# Patient Record
Sex: Male | Born: 1965 | Race: Black or African American | Hispanic: No | Marital: Married | Smoking: Never smoker
Health system: Southern US, Community
[De-identification: ages and names within clinical notes are randomized; demographics above are authoritative.]

## PROBLEM LIST (undated history)

## (undated) DIAGNOSIS — I10 Essential (primary) hypertension: Secondary | ICD-10-CM

## (undated) HISTORY — DX: Essential (primary) hypertension: I10

---

## 2000-05-12 ENCOUNTER — Ambulatory Visit (HOSPITAL_COMMUNITY): Admission: RE | Admit: 2000-05-12 | Discharge: 2000-05-12 | Payer: Self-pay | Admitting: Internal Medicine

## 2008-01-29 ENCOUNTER — Encounter: Admission: RE | Admit: 2008-01-29 | Discharge: 2008-01-29 | Payer: Self-pay | Admitting: Family Medicine

## 2010-11-22 ENCOUNTER — Other Ambulatory Visit: Payer: Self-pay | Admitting: Gastroenterology

## 2010-11-26 ENCOUNTER — Ambulatory Visit
Admission: RE | Admit: 2010-11-26 | Discharge: 2010-11-26 | Disposition: A | Discharge status: Home / Self Care | Payer: 59 | Source: Ambulatory Visit | Attending: Gastroenterology | Admitting: Gastroenterology

## 2013-10-17 IMAGING — US US ABDOMEN COMPLETE
1 series · 14 of 25 positions shown · non-contrast
Comparison: None

CLINICAL DATA: Abdominal pain for 1 month

ABDOMINAL ULTRASOUND COMPLETE

[Series 1: us abdomen complete · 0.26mm/px · 14 of 59 slices shown]
[im 1/59]
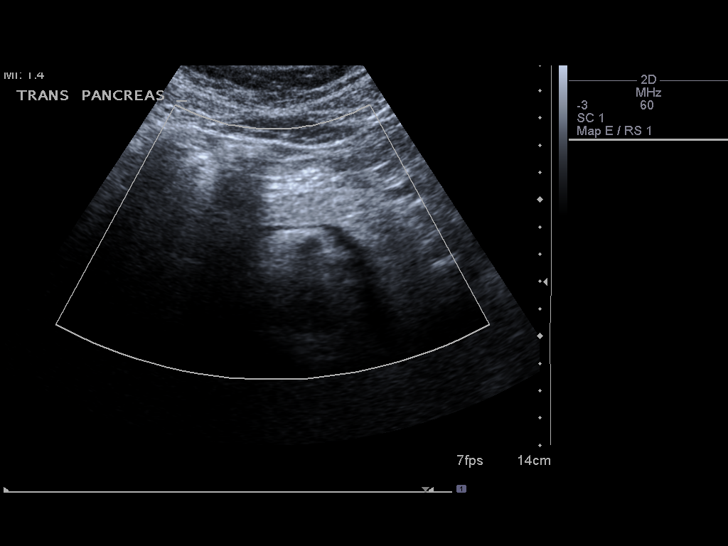
[im 5/59]
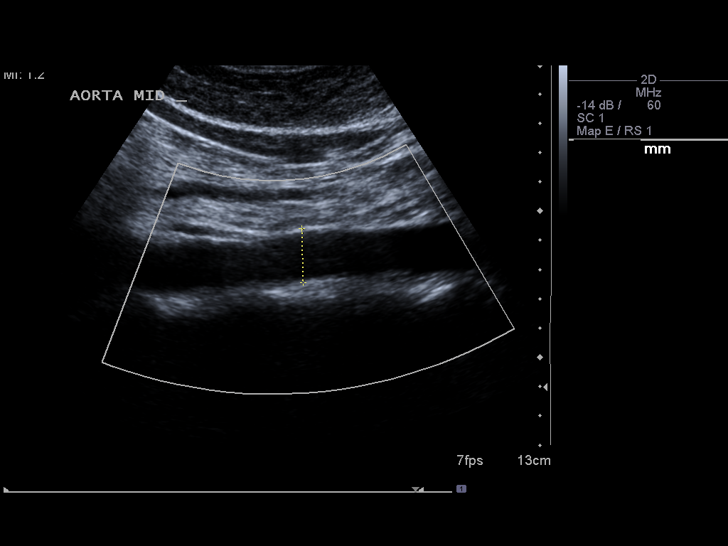
[im 10/59]
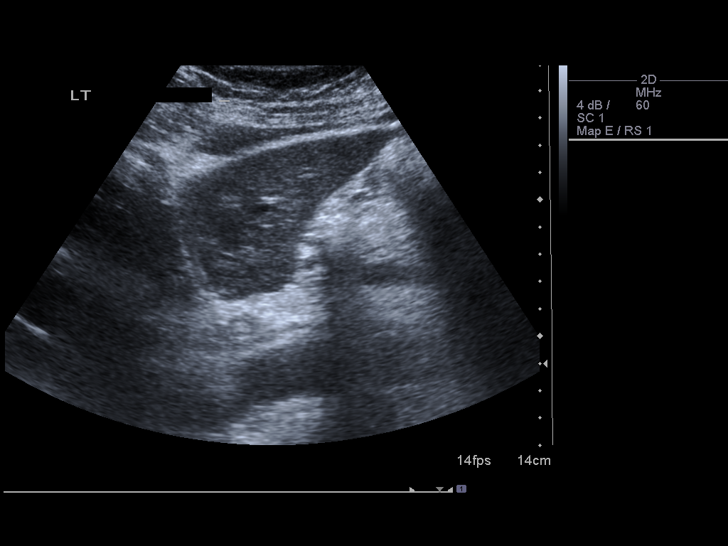
[im 15/59]
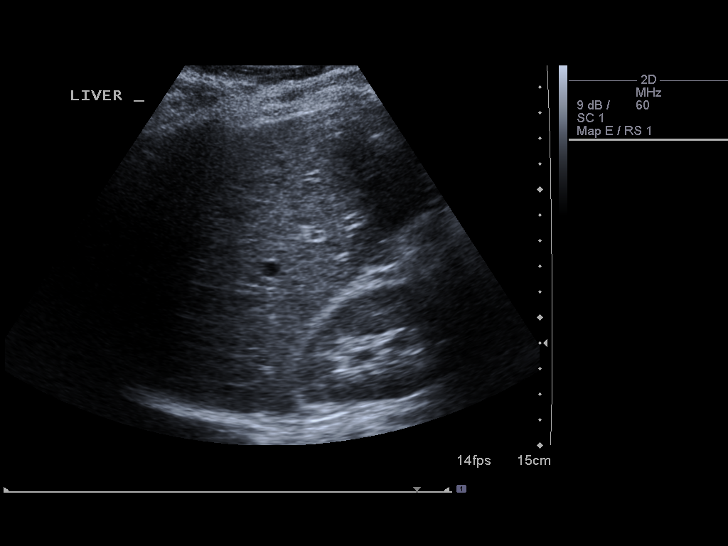
[im 20/59]
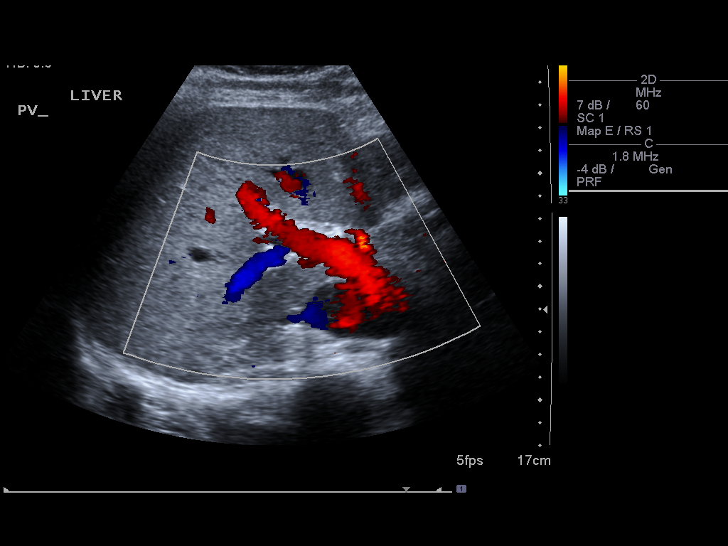
[im 22/59]
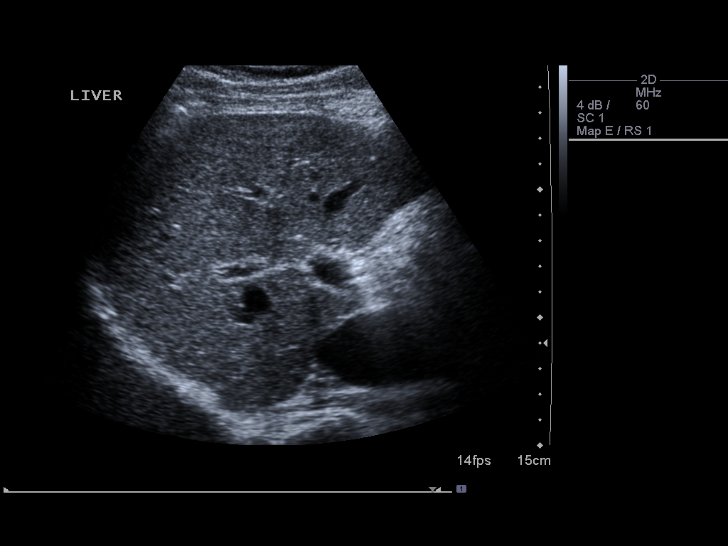
[im 27/59]
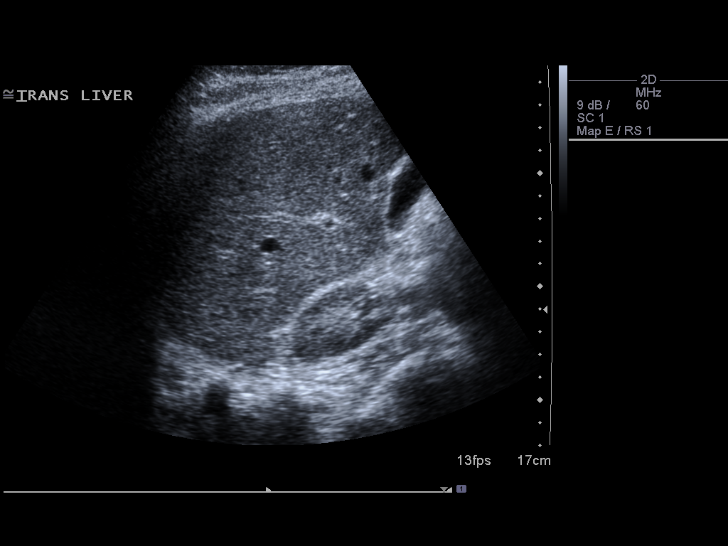
[im 32/59]
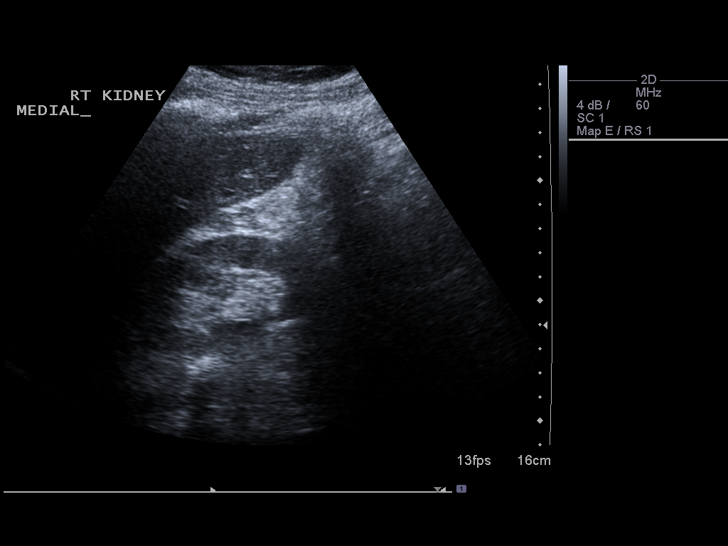
[im 37/59]
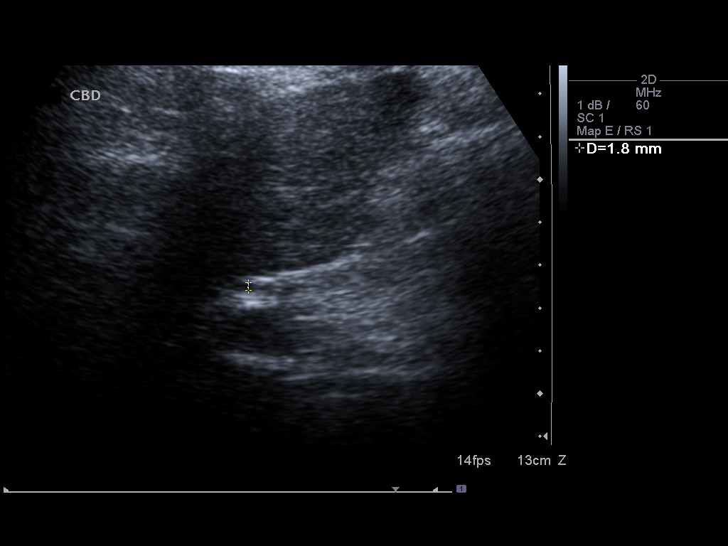
[im 39/59]
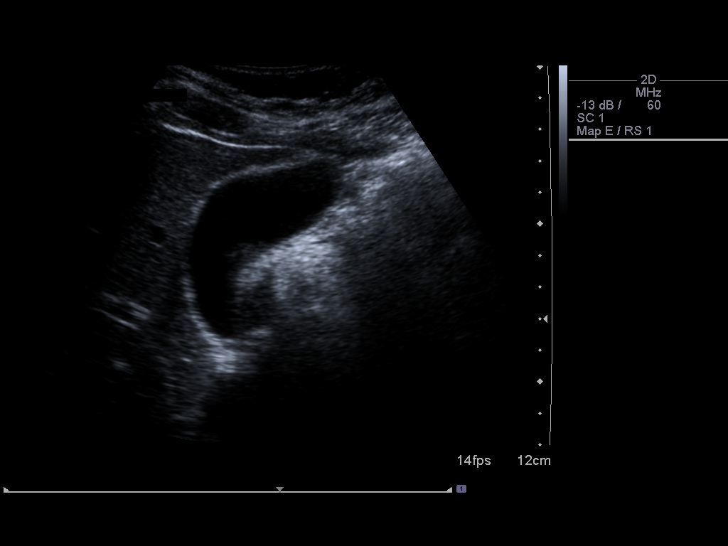
[im 44/59]
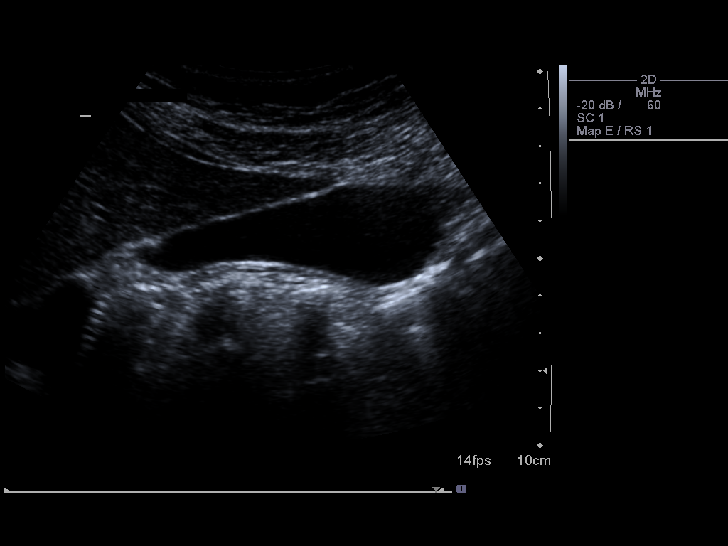
[im 49/59]
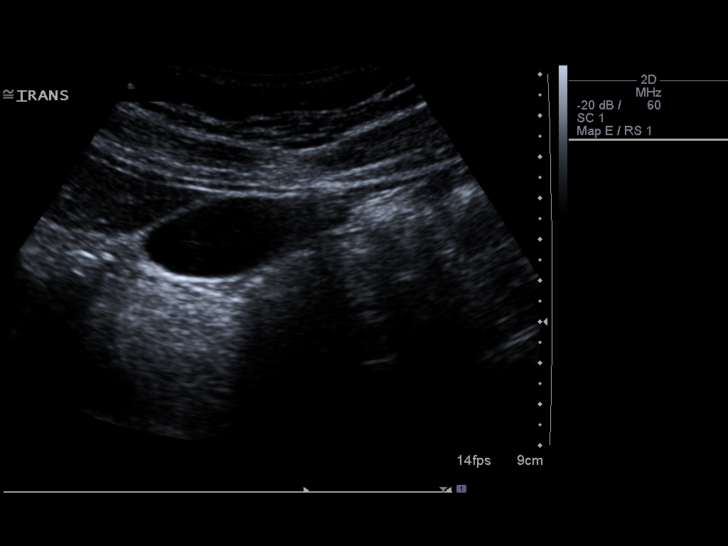
[im 54/59]
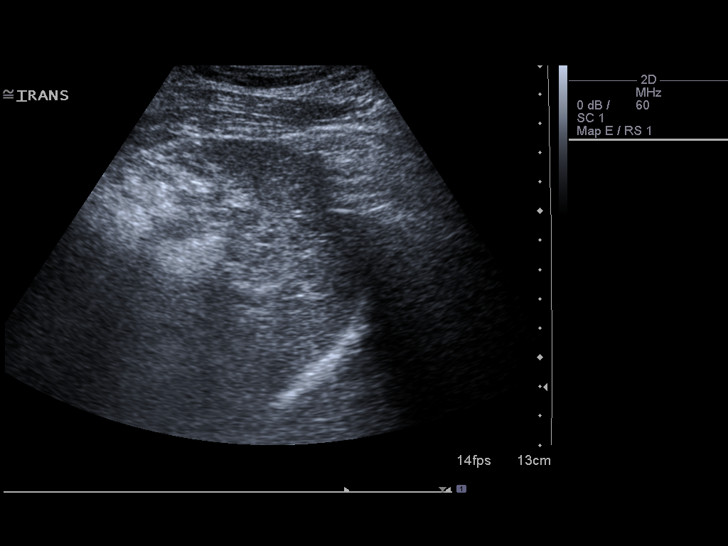
[im 59/59]
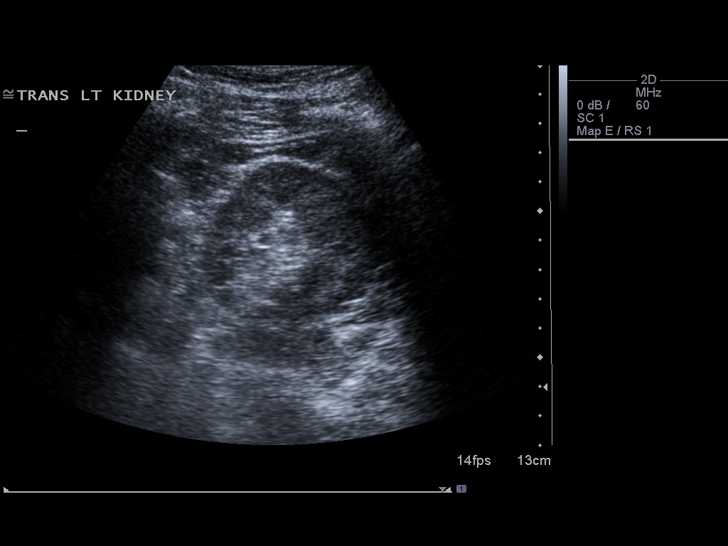

[14 of 25 positions shown; findings below may reference images not displayed]

FINDINGS: Gallbladder:  No gallstones, gallbladder wall thickening, or
pericholecystic fluid.

Common Bile Duct:  Within normal limits in caliber.

Liver: No focal mass lesion identified.  Within normal limits in
parenchymal echogenicity.

IVC:  Appears normal.

Pancreas:  No abnormality identified.

Spleen:  Within normal limits in size and echotexture.

Right kidney:  Normal in size and parenchymal echogenicity.  No
evidence of mass or hydronephrosis.

Left kidney:  Normal in size and parenchymal echogenicity.  No
evidence of mass or hydronephrosis.

Abdominal Aorta:  No aneurysm identified.
IMPRESSION: Negative abdominal ultrasound.

## 2014-05-05 ENCOUNTER — Ambulatory Visit (INDEPENDENT_AMBULATORY_CARE_PROVIDER_SITE_OTHER)
Admission: RE | Admit: 2014-05-05 | Discharge: 2014-05-05 | Disposition: A | Discharge status: Home / Self Care | Payer: 59 | Source: Ambulatory Visit | Attending: Internal Medicine | Admitting: Internal Medicine

## 2014-05-05 ENCOUNTER — Ambulatory Visit (INDEPENDENT_AMBULATORY_CARE_PROVIDER_SITE_OTHER): Payer: 59 | Admitting: Internal Medicine

## 2014-05-05 ENCOUNTER — Encounter: Payer: Self-pay | Admitting: Internal Medicine

## 2014-05-05 ENCOUNTER — Encounter (INDEPENDENT_AMBULATORY_CARE_PROVIDER_SITE_OTHER): Payer: Self-pay

## 2014-05-05 VITALS — BP 156/84 | HR 75 | Ht 70.0 in | Wt 237.8 lb

## 2014-05-05 DIAGNOSIS — R058 Other specified cough: Secondary | ICD-10-CM

## 2014-05-05 DIAGNOSIS — R05 Cough: Secondary | ICD-10-CM | POA: Diagnosis not present

## 2014-05-05 DIAGNOSIS — I10 Essential (primary) hypertension: Secondary | ICD-10-CM

## 2014-05-05 MED ORDER — PREDNISONE 10 MG PO TABS: ORAL_TABLET | ORAL | Status: DC

## 2014-05-05 MED ORDER — NEBIVOLOL HCL 5 MG PO TABS: 5.0000 mg | ORAL_TABLET | Freq: Every day | ORAL | Status: DC

## 2014-05-05 NOTE — Progress Notes (Signed)
Quick Note:  Spoke with pt and notified of results per Dr. Wert. Pt verbalized understanding and denied any questions.  ______ 

## 2014-05-05 NOTE — Progress Notes (Signed)
   Subjective:    Patient ID: Jonathan Ferrell, male    DOB: [birth year],     MRN: [id number]  HPI  [age] yobm never smoker with problems sneezing/coughing spring = fall since his 20's then onset of persistent year round cough 2015  on ACEi which was stopped in March 2016 and no improvement in cough but then his "spring allergies started up" and wasn't sure stopping the acei helped so referred 05/05/2014  by Dr Azucena CecilSwayne to pulmonary clinic.  05/05/2014 1st [facility] office visit/ Wert   Chief Complaint  Patient presents with  . Pulmonary Consult    Referred by Dr. Tally Joeavid Swayne. Pt c/o cough for the past yr.  Cough is non prod and has no specific trigger.  ACE was d/ced approx 3 wks ago and he has not noticed difference in cough "although allergy season started around the same time".   cough worse day than night assoc throat tickle and doesn't typically wake him up Aggravated by talking and eating Took gerd rx x one week no better Allergy symptoms marginally controlled on non-sedating antihistamines   No obvious other patterns in day to day or daytime variabilty or assoc chronic sob or cp or chest tightness, subjective wheeze or overt hb symptoms. No unusual exp hx or h/o childhood pna/ asthma or knowledge of premature birth.  Sleeping ok without nocturnal  or early am exacerbation  of respiratory  c/o's or need for noct saba. Also denies any obvious fluctuation of symptoms with weather or environmental changes or other aggravating or alleviating factors except as outlined above   Current Medications, Allergies, Complete Past Medical History, Past Surgical History, Family History, and Social History were reviewed in [facility] electronic medical record.             Review of Systems  Constitutional: Negative for fever, chills, activity change, appetite change and unexpected weight change.  HENT: Negative for congestion, dental problem, postnasal drip, rhinorrhea, sneezing, sore  throat, trouble swallowing and voice change.   Eyes: Negative for visual disturbance.  Respiratory: Positive for cough. Negative for choking and shortness of breath.   Cardiovascular: Negative for chest pain and leg swelling.  Gastrointestinal: Positive for abdominal pain. Negative for nausea and vomiting.  Genitourinary: Negative for difficulty urinating.  Musculoskeletal: Positive for arthralgias.  Skin: Negative for rash.  Psychiatric/Behavioral: Negative for behavioral problems and confusion.       Objective:   Physical Exam   Pleasant amb bm minimal nasal tone to voice HEENT: nl dentition, turbinates, and orophanx. Nl external ear canals without cough reflex   NECK :  without JVD/Nodes/TM/ nl carotid upstrokes bilaterally   LUNGS: no acc muscle use, clear to A and P bilaterally without cough on insp or exp maneuvers   CV:  RRR  no s3 or murmur or increase in P2, no edema   ABD:  soft and nontender with nl excursion in the supine position. No bruits or organomegaly, bowel sounds nl  MS:  warm without deformities, calf tenderness, cyanosis or clubbing  SKIN: warm and dry without lesions    NEURO:  alert, approp, no deficits     I personally reviewed images and agree with radiology impression as follows:  CXR:  05/05/14 No active dz         Assessment & Plan:

## 2014-05-05 NOTE — Patient Instructions (Signed)
Please remember to go to the   x-ray department downstairs for your tests - we will call you with the results when they are available.  Prednisone 10 mg take  4 each am x 2 days,   2 each am x 2 days,  1 each am x 2 days and stop   Zyrtec 10 mg at bedtime x until return   Stop losartan and take bystolic 5 mg daily   GERD (REFLUX)  is an extremely common cause of respiratory symptoms just like yours , many times with no obvious heartburn at all.    It can be treated with medication, but also with lifestyle changes including avoidance of late meals, excessive alcohol, smoking cessation, and avoid fatty foods, chocolate, peppermint, colas, red wine, and acidic juices such as orange juice.  NO MINT OR MENTHOL PRODUCTS SO NO COUGH DROPS  USE SUGARLESS CANDY INSTEAD (Jolley ranchers or Stover's or Life Savers) or even ice chips will also do - the key is to swallow to prevent all throat clearing. NO OIL BASED VITAMINS - use powdered substitutes.    Please schedule a follow up office visit in 2 weeks, sooner if needed

## 2014-05-06 ENCOUNTER — Encounter: Payer: Self-pay | Admitting: Internal Medicine

## 2014-05-06 DIAGNOSIS — I10 Essential (primary) hypertension: Secondary | ICD-10-CM | POA: Insufficient documentation

## 2014-05-06 NOTE — Assessment & Plan Note (Signed)
D/c ARB 05/05/14 and started bystolic 10 mg daily due to cough

## 2014-05-06 NOTE — Assessment & Plan Note (Addendum)
-   d/c Acei March 2016 - D/c cozar 05/05/2014    Classic Upper airway cough syndrome, so named because it's frequently impossible to sort out how much is  CR/sinusitis with freq throat clearing (which can be related to primary GERD)   vs  causing  secondary (" extra esophageal")  GERD from wide swings in gastric pressure that occur with throat clearing, often  promoting self use of mint and menthol lozenges that reduce the lower esophageal sphincter tone and exacerbate the problem further in a cyclical fashion.   These are the same pts (now being labeled as having "irritable larynx syndrome" by some cough centers) who not infrequently have a history of having failed to tolerate ace inhibitors,  dry powder inhalers or biphosphonates or report having atypical reflux symptoms that don't respond to standard doses of PPI , and are easily confused as having aecopd or asthma flares by even experienced allergists/ pulmonologists.   For reasons that may related to vascular permability and nitric oxide pathways but not elevated  bradykinin levels (as seen with  ACEi use) losartan in the generic form has been reported now from mulitple sources  to cause a similar pattern of non-specific  upper airway symptoms as seen with acei.     See:  [facility] Immunol  2008: 101: p 495-499    For now will try off arb and on bystolic and try to work on eliminating cyclical coughing/ throat clearing and regroup

## 2014-05-20 ENCOUNTER — Ambulatory Visit (INDEPENDENT_AMBULATORY_CARE_PROVIDER_SITE_OTHER): Payer: 59 | Admitting: Internal Medicine

## 2014-05-20 ENCOUNTER — Encounter: Payer: Self-pay | Admitting: Internal Medicine

## 2014-05-20 VITALS — BP 130/84 | HR 78 | Ht 70.0 in | Wt 233.0 lb

## 2014-05-20 DIAGNOSIS — R05 Cough: Secondary | ICD-10-CM | POA: Diagnosis not present

## 2014-05-20 DIAGNOSIS — I10 Essential (primary) hypertension: Secondary | ICD-10-CM

## 2014-05-20 DIAGNOSIS — R058 Other specified cough: Secondary | ICD-10-CM

## 2014-05-20 MED ORDER — NEBIVOLOL HCL 5 MG PO TABS: 5.0000 mg | ORAL_TABLET | Freq: Every day | ORAL | Status: AC

## 2014-05-20 NOTE — Progress Notes (Signed)
Subjective:    Patient ID: Jonathan Ferrell, male    DOB: 07/09/1965,     MRN: [id number]    Brief patient profile:  48 yobm never smoker with problems sneezing/coughing spring = fall since his 20's then onset of persistent year round cough 2015  on ACEi which was stopped in March 2016 and no improvement in cough but then his "spring allergies started up" and wasn't sure stopping the acei helped so referred 05/05/2014  by Dr Azucena Cecil to pulmonary clinic.   History of Present Illness  05/05/2014 1st [facility] office visit/ [name]   Chief Complaint  Patient presents with  . Pulmonary Consult    Referred by Dr. [name]. Pt c/o cough for the past yr.  Cough is non prod and has no specific trigger.  ACE was d/ced approx 3 wks ago and he has not noticed difference in cough "although allergy season started around the same time".   cough worse day than night assoc throat tickle and doesn't typically wake him up Aggravated by talking and eating Took gerd rx x one week no better Allergy symptoms marginally controlled on non-sedating antihistamines  rec Please remember to go to the   x-ray department downstairs for your tests - we will call you with the results when they are available. Prednisone 10 mg take  4 each am x 2 days,   2 each am x 2 days,  1 each am x 2 days and stop  Zyrtec 10 mg at bedtime x until return  Stop losartan and take bystolic 5 mg daily  GERD diet    05/20/2014 f/u ov/[name] re: chronic cough much better / bp f/u on bysotlic 5  Chief Complaint  Patient presents with  . Follow-up    Cough is much improved, but not completely resolved. No new co's.    no more  spells x one week on neurontin   Per ortho Allergy symptoms better on zyrtec at hs   No obvious day to day or daytime variabilty or assoc sob or cp or chest tightness, subjective wheeze overt sinus or hb symptoms. No unusual exp hx or h/o childhood pna/ asthma or knowledge of premature birth.  Sleeping ok  without nocturnal  or early am exacerbation  of respiratory  c/o's or need for noct saba. Also denies any obvious fluctuation of symptoms with weather or environmental changes or other aggravating or alleviating factors except as outlined above   Current Medications, Allergies, Complete Past Medical History, Past Surgical History, Family History, and Social History were reviewed in [facility] record.  ROS  The following are not active complaints unless bolded sore throat, dysphagia, dental problems, itching, sneezing,  nasal congestion or excess/ purulent secretions, ear ache,   fever, chills, sweats, unintended wt loss, pleuritic or exertional cp, hemoptysis,  orthopnea pnd or leg swelling, presyncope, palpitations, heartburn, abdominal pain, anorexia, nausea, vomiting, diarrhea  or change in bowel or urinary habits, change in stools or urine, dysuria,hematuria,  rash, arthralgias, visual complaints, headache, numbness weakness or ataxia or problems with walking or coordination,  change in mood/affect or memory.              Objective:   Physical Exam  Wt Readings from Last 3 Encounters:  05/20/14 233 lb (105.688 kg)  05/05/14 237 lb 12.8 oz (107.865 kg)    Vital signs reviewed    Pleasant amb bm minimal nasal tone to voice  HEENT: nl dentition, turbinates, and  orophanx. Nl external ear canals without cough reflex   NECK :  without JVD/Nodes/TM/ nl carotid upstrokes bilaterally   LUNGS: no acc muscle use, clear to A and P bilaterally without cough on insp or exp maneuvers   CV:  RRR  no s3 or murmur or increase in P2, no edema   ABD:  soft and nontender with nl excursion in the supine position. No bruits or organomegaly, bowel sounds nl  MS:  warm without deformities, calf tenderness, cyanosis or clubbing  SKIN: warm and dry without lesions    NEURO:  alert, approp, no deficits     I personally reviewed images and agree with radiology impression  as follows:  CXR:  05/05/14 No active dz         Assessment & Plan:

## 2014-05-20 NOTE — Assessment & Plan Note (Signed)
-   d/c Acei March 2016 - D/c cozar 05/05/2014  - last coughed 05/15/14 p starting neurontin per neuro   Still strongly feel this is  Classic Upper airway cough syndrome, so named because it's frequently impossible to sort out how much is  CR/sinusitis with freq throat clearing (which can be related to primary GERD)   vs  causing  secondary (" extra esophageal")  GERD from wide swings in gastric pressure that occur with throat clearing, often  promoting self use of mint and menthol lozenges that reduce the lower esophageal sphincter tone and exacerbate the problem further in a cyclical fashion.   These are the same pts (now being labeled as having "irritable larynx syndrome" by some cough centers) who not infrequently have a history of having failed to tolerate ace inhibitors,  dry powder inhalers or biphosphonates or report having atypical reflux symptoms that don't respond to standard doses of PPI , and are easily confused as having aecopd or asthma flares by even experienced allergists/ pulmonologists.   Only issue is whether cough will flare/ if or when neurontin is stopped > f/u prn

## 2014-05-20 NOTE — Patient Instructions (Addendum)
Fill the prescription for bystolic 5 mg daily and let us know if you can't fill and we will call you in a substitute but x 6 months  . If you are satisfied with your treatment plan,  let your doctor know and he/she can either refill your medications or you can return here when your prescription runs out.     If in any way you are not 100% satisfied,  please tell us.  If 100% better, tell your friends!  Pulmonary follow up is as needed

## 2014-05-20 NOTE — Assessment & Plan Note (Signed)
H/o ace intol >>> D/c ARB 05/05/14 and started bystolic5 mg daily due to cough > bp ok on 5 mg daily so rec stay on bystolic until f/u by primary care

## 2015-02-28 ENCOUNTER — Other Ambulatory Visit: Payer: Self-pay | Admitting: Internal Medicine

## 2015-03-01 DIAGNOSIS — R002 Palpitations: Secondary | ICD-10-CM | POA: Insufficient documentation

## 2015-03-06 ENCOUNTER — Ambulatory Visit (INDEPENDENT_AMBULATORY_CARE_PROVIDER_SITE_OTHER): Payer: 59

## 2015-03-06 DIAGNOSIS — R002 Palpitations: Secondary | ICD-10-CM | POA: Diagnosis not present

## 2015-04-03 ENCOUNTER — Encounter: Payer: Self-pay | Admitting: Internal Medicine

## 2015-04-03 ENCOUNTER — Ambulatory Visit (INDEPENDENT_AMBULATORY_CARE_PROVIDER_SITE_OTHER): Payer: 59 | Admitting: Internal Medicine

## 2015-04-03 ENCOUNTER — Ambulatory Visit (INDEPENDENT_AMBULATORY_CARE_PROVIDER_SITE_OTHER)
Admission: RE | Admit: 2015-04-03 | Discharge: 2015-04-03 | Disposition: A | Discharge status: Home / Self Care | Payer: 59 | Source: Ambulatory Visit | Attending: Internal Medicine | Admitting: Internal Medicine

## 2015-04-03 VITALS — BP 132/92 | HR 73 | Temp 98.3°F | Ht 71.0 in | Wt 233.0 lb

## 2015-04-03 DIAGNOSIS — R058 Other specified cough: Secondary | ICD-10-CM

## 2015-04-03 DIAGNOSIS — R05 Cough: Secondary | ICD-10-CM

## 2015-04-03 DIAGNOSIS — I10 Essential (primary) hypertension: Secondary | ICD-10-CM

## 2015-04-03 MED ORDER — PREDNISONE 10 MG PO TABS: ORAL_TABLET | ORAL | Status: AC

## 2015-04-03 MED ORDER — AZITHROMYCIN 250 MG PO TABS
ORAL_TABLET | ORAL | Status: AC
Start: 2015-04-03 — End: ?

## 2015-04-03 NOTE — Progress Notes (Signed)
Subjective:    Patient ID: Jonathan Ferrell, male    DOB: 02-Sep-1965,     MRN: [id number]    Brief patient profile:  48 yobm never smoker with problems sneezing/coughing spring = fall since his 20's then onset of persistent year round cough 2015  on ACEi which was stopped in March 2016 and no improvement in cough but then his "spring allergies started up" and wasn't sure stopping the acei helped so referred 05/05/2014  by Dr Azucena CecilSwayne to pulmonary clinic.   History of Present Illness  05/05/2014 1st [facility] office visit/ Wert   Chief Complaint  Patient presents with  . Pulmonary Consult    Referred by Dr. Tally Joeavid Swayne. Pt c/o cough for the past yr.  Cough is non prod and has no specific trigger.  ACE was d/ced approx 3 wks ago and he has not noticed difference in cough "although allergy season started around the same time".   cough worse day than night assoc throat tickle and doesn't typically wake him up Aggravated by talking and eating Took gerd rx x one week no better Allergy symptoms marginally controlled on non-sedating antihistamines  rec Please remember to go to the   x-ray department downstairs for your tests - we will call you with the results when they are available. Prednisone 10 mg take  4 each am x 2 days,   2 each am x 2 days,  1 each am x 2 days and stop  Zyrtec 10 mg at bedtime x until return  Stop losartan and take bystolic 5 mg daily  GERD diet    05/20/2014 f/u ov/Wert re: chronic cough much better / bp f/u on bysotlic 5  Chief Complaint  Patient presents with  . Follow-up    Cough is much improved, but not completely resolved. No new co's.  no more  spells x one week on neurontin   Per ortho Allergy symptoms better on zyrtec at hs  rec No change rx   04/03/2015 acute extended ov/Wert re: recurrent cough in setting of uri  Off gabapentin x months s cough flare until now Chief Complaint  Patient presents with  . Acute Visit    Increased cough x 3 wks-  prod with yellow/clear sputum.    Acutely ill since Mar 11 2015  with cough > [facility]/ restart zyrtec some better > mucus remains yellowish clear/ cough is worse day than noct    No obvious day to day or daytime variabilty or assoc sob or cp or chest tightness, subjective wheeze overt sinus or hb symptoms. No unusual exp hx or h/o childhood pna/ asthma or knowledge of premature birth.  Sleeping ok without nocturnal  or early am exacerbation  of respiratory  c/o's or need for noct saba. Also denies any obvious fluctuation of symptoms with weather or environmental changes or other aggravating or alleviating factors except as outlined above   Current Medications, Allergies, Complete Past Medical History, Past Surgical History, Family History, and Social History were reviewed in [facility] electronic medical record.  ROS  The following are not active complaints unless bolded sore throat, dysphagia, dental problems, itching, sneezing,  nasal congestion or excess/ purulent secretions, ear ache,   fever, chills, sweats, unintended wt loss, pleuritic or exertional cp, hemoptysis,  orthopnea pnd or leg swelling, presyncope, palpitations, heartburn, abdominal pain, anorexia, nausea, vomiting, diarrhea  or change in bowel or urinary habits, change in stools or urine,  dysuria,hematuria,  rash, arthralgias, visual complaints, headache, numbness weakness or ataxia or problems with walking or coordination,  change in mood/affect or memory.              Objective:   Physical Exam  Pleasant amb bm minimal nasal tone to voice and cobblestoning   . Wt Readings from Last 3 Encounters:  04/03/15 233 lb (105.688 kg)  05/20/14 233 lb (105.688 kg)  05/05/14 237 lb 12.8 oz (107.865 kg)    Vital signs reviewed   HEENT: nl dentition, turbinates, and orophanx. Nl external ear canals without cough reflex   NECK :  without JVD/Nodes/TM/ nl carotid upstrokes  bilaterally   LUNGS: no acc muscle use, clear to A and P bilaterally without cough on insp or exp maneuvers   CV:  RRR  no s3 or murmur or increase in P2, no edema   ABD:  soft and nontender with nl excursion in the supine position. No bruits or organomegaly, bowel sounds nl  MS:  warm without deformities, calf tenderness, cyanosis or clubbing  SKIN: warm and dry without lesions    NEURO:  alert, approp, no deficits    CXR PA and Lateral:   04/03/2015 :    I personally reviewed images and agree with radiology impression as follows:   Normal examination.       Assessment & Plan:

## 2015-04-03 NOTE — Progress Notes (Signed)
Quick Note:  Spoke with pt and notified of results per Dr. Wert. Pt verbalized understanding and denied any questions.  ______ 

## 2015-04-03 NOTE — Patient Instructions (Signed)
Try prilosec otc 20mg   Take 30-60 min before first meal of the day and Pepcid ac (famotidine) 20 mg one @  bedtime until cough is completely gone for at least a week without the need for cough suppression  Best cough medication is delsym 2 tsp every 12 hours  GERD (REFLUX)  is an extremely common cause of respiratory symptoms just like yours , many times with no obvious heartburn at all.    It can be treated with medication, but also with lifestyle changes including elevation of the head of your bed (ideally with 6 inch  bed blocks),  Smoking cessation, avoidance of late meals, excessive alcohol, and avoid fatty foods, chocolate, peppermint, colas, red wine, and acidic juices such as orange juice.  NO MINT OR MENTHOL PRODUCTS SO NO COUGH DROPS  USE SUGARLESS CANDY INSTEAD (Jolley ranchers or Stover's or Life Savers) or even ice chips will also do - the key is to swallow to prevent all throat clearing. NO OIL BASED VITAMINS - use powdered substitutes - stop fish oil  Zpak/ Prednisone 10 mg take  4 each am x 2 days,   2 each am x 2 days,  1 each am x 2 days and stop   Please remember to go to the x-ray department downstairs for your tests - we will call you with the results when they are available.

## 2015-04-04 NOTE — Assessment & Plan Note (Signed)
Not ideal   control on present rx, reviewed > no change in rx needed  For now > Follow up per Primary Care planned

## 2015-04-04 NOTE — Assessment & Plan Note (Signed)
-   d/c Acei March 2016 - D/c cozar 05/05/2014  - last coughed 05/15/14 p starting neurontin per neuro> stopped s recurrence   Was fine until apparent uri. Explained the natural history of uri and why it's necessary in patients at risk to treat GERD aggressively - at least  short term -   to reduce risk of evolving cyclical cough initially  triggered by epithelial injury and a heightened sensitivty to the effects of any upper airway irritants,  most importantly acid - related - then perpetuated by epithelial injury related to the cough itself as the upper airway collapses on itself.  That is, the more sensitive the epithelium becomes once it is damaged by the virus, the more the ensuing irritability> the more the cough, the more the secondary reflux (especially in those prone to reflux) the more the irritation of the sensitive mucosa and so on in a  Classic cyclical pattern.    Will rx with zpak /

## 2015-09-05 ENCOUNTER — Telehealth: Payer: Self-pay | Admitting: Internal Medicine

## 2015-09-05 NOTE — Telephone Encounter (Signed)
Patient calling to get discount card for his Bystolic.  His current discount card has expired.    Checked the samples cabinet, the Bystolic cards we had were all expired.  Did not have any current cards.  Patient aware. Nothing further needed.

## 2017-03-26 IMAGING — CR DG CHEST 2V
2 series · 2 of 2 positions shown · non-contrast
Comparison: None.

CLINICAL DATA: Chronic cough.  Hypertension.

EXAM:
CHEST  2 VIEW

[view not recorded (1 of 2)]
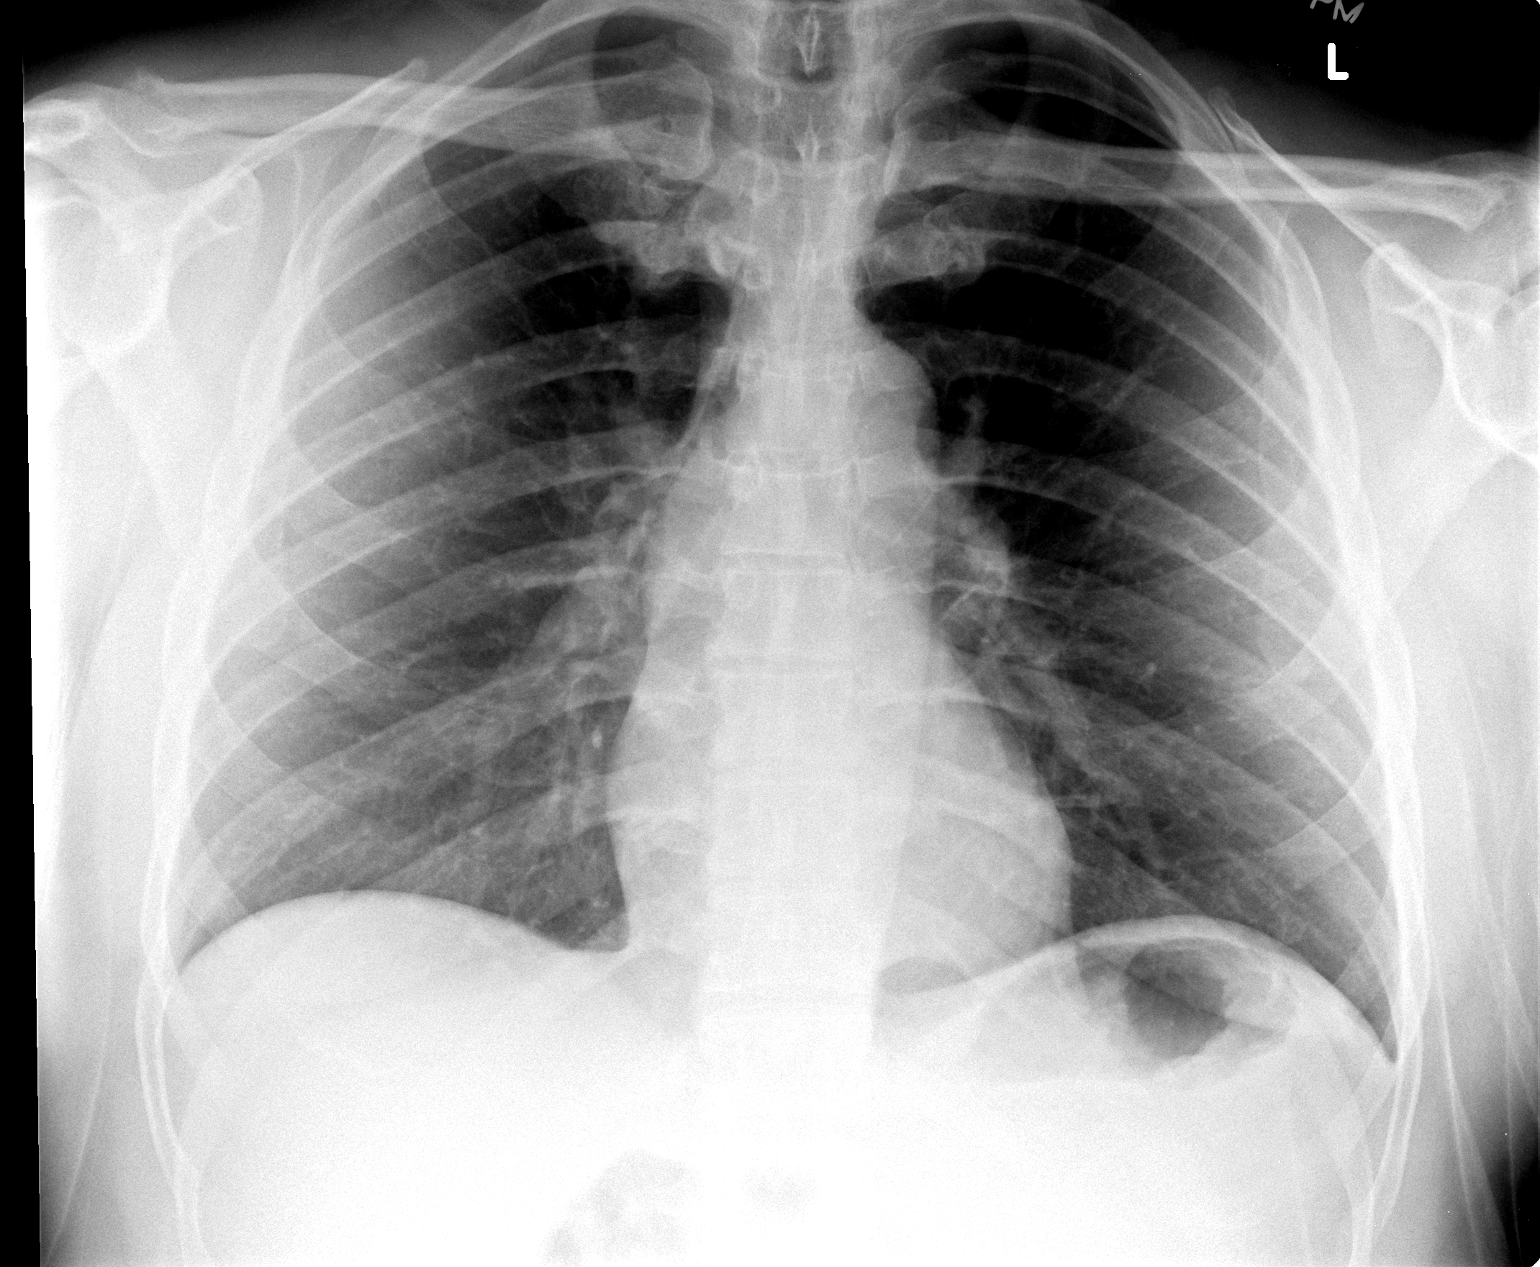

[view not recorded (2 of 2)]
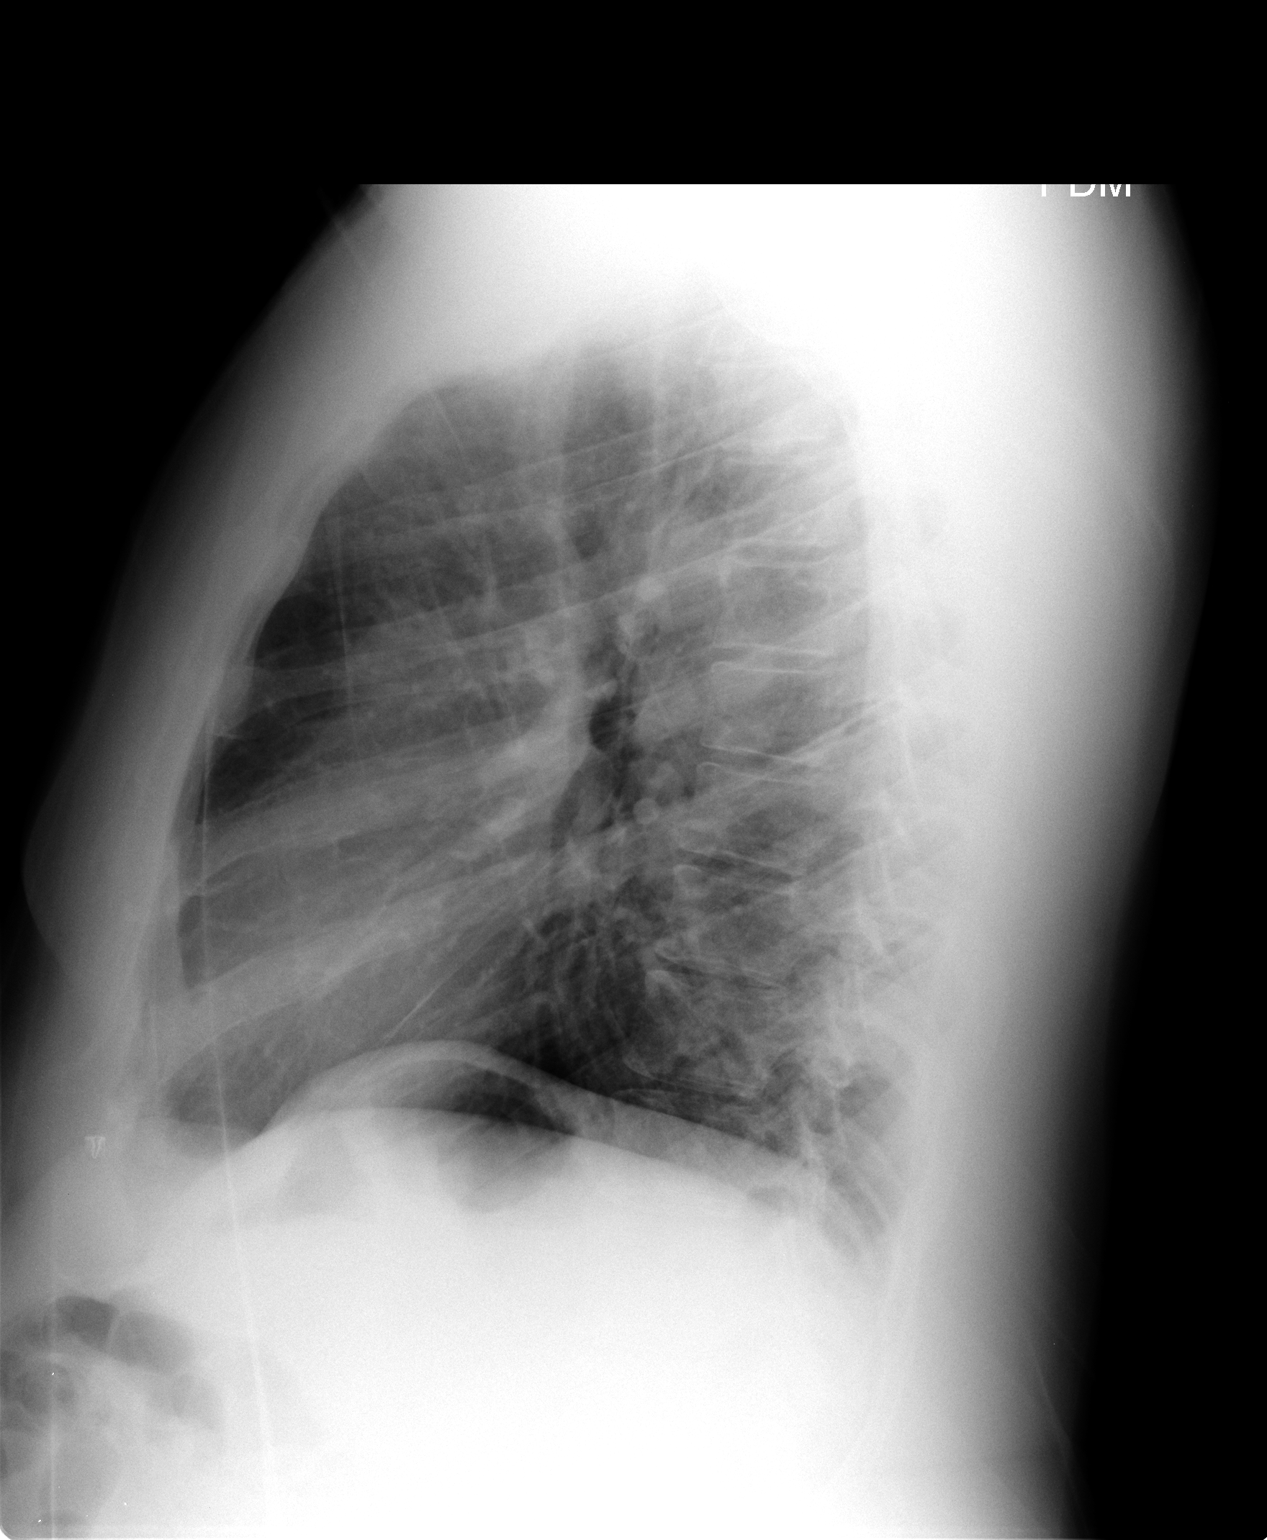

[2 of 2 positions shown; findings below may reference images not displayed]

FINDINGS: Lungs are adequately inflated without consolidation or effusion. The
cardiomediastinal silhouette is within normal. Bones and soft
tissues are normal.
IMPRESSION: No active cardiopulmonary disease.

## 2018-04-22 NOTE — H&P (Signed)
TOTAL KNEE ADMISSION H&P  Patient is being admitted for right total knee arthroplasty.  Subjective:  Chief Complaint:   Right knee primary OA / pain  HPI: Jonathan Ferrell, 53 y.o. male, has a history of pain and functional disability in the right knee due to arthritis and has failed non-surgical conservative treatments for greater than 12 weeks to include NSAID's and/or analgesics, corticosteriod injections and activity modification.  Onset of symptoms was gradual, starting 2+ years ago with gradually worsening course since that time. The patient noted prior procedures on the knee to include  arthroscopy and menisectomy on the right knee(s).  Patient currently rates pain in the right knee(s) at 8 out of 10 with activity. Patient has night pain, worsening of pain with activity and weight bearing, pain that interferes with activities of daily living, pain with passive range of motion, crepitus and joint swelling.  Patient has evidence of periarticular osteophytes and joint space narrowing by imaging studies.  There is no active infection.  Risks, benefits and expectations were discussed with the patient.  Risks including but not limited to the risk of anesthesia, blood clots, nerve damage, blood vessel damage, failure of the prosthesis, infection and up to and including death.  Patient understand the risks, benefits and expectations and wishes to proceed with surgery.   PCP: [name], MD  D/C Plans:       Home   Post-op Meds:       No Rx given   Tranexamic Acid:      To be given - IV   Decadron:      Is to be given  FYI:      ASA  Norco  DME:   Pt already has equipment   PT:   OPPT    Pharmacy: [facility] ([address] [place], [place], [place] [address])   Patient Active Problem List   Diagnosis Date Noted  . Palpitations 03/01/2015  . Essential hypertension 05/06/2014  . Upper airway cough syndrome 05/05/2014   Past Medical History:  Diagnosis Date  . Hypertension       No  current facility-administered medications for this encounter.    Current Outpatient Medications  Medication Sig Dispense Refill Last Dose  . Ascorbic Acid (VITAMIN C) 1000 MG tablet Take 1,000 mg by mouth daily.   Taking  . azithromycin (ZITHROMAX) 250 MG tablet Take 2 on day one then 1 daily x 4 days 6 tablet 0   . cetirizine (ZYRTEC) 10 MG tablet Take 10 mg by mouth daily.   Taking  . fluticasone (FLONASE) 50 MCG/ACT nasal spray Place 2 sprays into both nostrils 2 (two) times daily.   Taking  . Multiple Vitamins-Minerals (ALIVE MENS ENERGY PO) Take 1 tablet by mouth daily.   Taking  . nebivolol (BYSTOLIC) 5 MG tablet Take 1 tablet (5 mg total) by mouth daily. 90 tablet 2 Taking  . Omega-3 Fatty Acids (FISH OIL) 1200 MG CAPS Take 1 capsule by mouth daily.   Taking  . predniSONE (DELTASONE) 10 MG tablet Take  4 each am x 2 days,   2 each am x 2 days,  1 each am x 2 days and stop 14 tablet 0    No Known Allergies   Social History   Tobacco Use  . Smoking status: Never Smoker  . Smokeless tobacco: Never Used  Substance Use Topics  . Alcohol use: No    Alcohol/week: 0.0 standard drinks       Review of  Systems  Constitutional: Negative.   HENT: Negative.   Eyes: Negative.   Respiratory: Positive for cough.   Cardiovascular: Negative.   Gastrointestinal: Negative.   Genitourinary: Negative.   Musculoskeletal: Positive for joint pain.  Skin: Negative.   Neurological: Negative.   Endo/Heme/Allergies: Negative.   Psychiatric/Behavioral: Negative.     Objective:  Physical Exam  Constitutional: He is oriented to person, place, and time. He appears well-developed.  HENT:  Head: Normocephalic.  Eyes: Pupils are equal, round, and reactive to light.  Neck: Neck supple. No JVD present. No tracheal deviation present. No thyromegaly present.  Cardiovascular: Normal rate, regular rhythm and intact distal pulses.  Respiratory: Effort normal and breath sounds normal. No respiratory  distress. He has no wheezes.  GI: Soft. There is no abdominal tenderness. There is no guarding.  Musculoskeletal:     Right knee: He exhibits decreased range of motion, swelling and bony tenderness. He exhibits no ecchymosis, no deformity, no laceration and no erythema. Tenderness found.  Lymphadenopathy:    He has no cervical adenopathy.  Neurological: He is alert and oriented to person, place, and time.  Skin: Skin is warm and dry.  Psychiatric: He has a normal mood and affect.     Labs:  Estimated body mass index is 32.5 kg/m as calculated from the following:   Height as of 04/03/15: 5\' 11"  (1.803 m).   Weight as of 04/03/15: 105.7 kg.   Imaging Review Plain radiographs demonstrate severe degenerative joint disease of the right knee.  The bone quality appears to be good for age and reported activity level.     Assessment/Plan:  End stage arthritis, right knee   The patient history, physical examination, clinical judgment of the provider and imaging studies are consistent with end stage degenerative joint disease of the right knee and total knee arthroplasty is deemed medically necessary. The treatment options including medical management, injection therapy arthroscopy and arthroplasty were discussed at length. The risks and benefits of total knee arthroplasty were presented and reviewed. The risks due to aseptic loosening, infection, stiffness, patella tracking problems, thromboembolic complications and other imponderables were discussed. The patient acknowledged the explanation, agreed to proceed with the plan and consent was signed. Patient is being admitted for inpatient treatment for surgery, pain control, PT, OT, prophylactic antibiotics, VTE prophylaxis, progressive ambulation and ADL's and discharge planning. The patient is planning to be discharged home.    Patient's anticipated LOS is less than 2 midnights, meeting these requirements: - Younger than 44 - Lives within 1  hour of care - Has a competent adult at home to recover with post-op recover - NO history of  - Chronic pain requiring opiods  - Diabetes  - Coronary Artery Disease  - Heart failure  - Heart attack  - Stroke  - DVT/VTE  - Cardiac arrhythmia  - Respiratory Failure/COPD  - Renal failure  - Anemia  - Advanced Liver disease     Anastasio Auerbach. Dawon Troop   PA-C  04/22/2018, 8:28 AM

## 2018-05-14 ENCOUNTER — Ambulatory Visit: Admit: 2018-05-14 | Payer: 59 | Admitting: Orthopedic Surgery

## 2018-05-14 SURGERY — ARTHROPLASTY, KNEE, TOTAL
Anesthesia: Spinal | Laterality: Right

## 2022-03-27 ENCOUNTER — Telehealth: Payer: Self-pay

## 2022-03-27 ENCOUNTER — Ambulatory Visit
Admission: EM | Admit: 2022-03-27 | Discharge: 2022-03-27 | Disposition: A | Discharge status: Home / Self Care | Payer: 59 | Attending: Urgent Care | Admitting: Urgent Care

## 2022-03-27 DIAGNOSIS — Z1152 Encounter for screening for COVID-19: Secondary | ICD-10-CM | POA: Diagnosis not present

## 2022-03-27 DIAGNOSIS — I10 Essential (primary) hypertension: Secondary | ICD-10-CM | POA: Insufficient documentation

## 2022-03-27 DIAGNOSIS — B349 Viral infection, unspecified: Secondary | ICD-10-CM | POA: Diagnosis not present

## 2022-03-27 DIAGNOSIS — R070 Pain in throat: Secondary | ICD-10-CM

## 2022-03-27 LAB — POCT RAPID STREP A (OFFICE): Rapid Strep A Screen: NEGATIVE

## 2022-03-27 MED ORDER — PSEUDOEPHEDRINE HCL 30 MG PO TABS: 30.0000 mg | ORAL_TABLET | Freq: Three times a day (TID) | ORAL | 0 refills | Status: DC | PRN

## 2022-03-27 MED ORDER — PSEUDOEPHEDRINE HCL 30 MG PO TABS: 30.0000 mg | ORAL_TABLET | Freq: Three times a day (TID) | ORAL | 0 refills | Status: AC | PRN

## 2022-03-27 MED ORDER — CETIRIZINE HCL 10 MG PO TABS: 10.0000 mg | ORAL_TABLET | Freq: Every day | ORAL | 0 refills | Status: DC

## 2022-03-27 MED ORDER — CETIRIZINE HCL 10 MG PO TABS: 10.0000 mg | ORAL_TABLET | Freq: Every day | ORAL | 0 refills | Status: AC

## 2022-03-27 NOTE — Discharge Instructions (Addendum)
We will notify you of your test results as they arrive and may take between about 24 hours.  I encourage you to sign up for MyChart if you have not already done so as this can be the easiest way for [place] to communicate results to you online or through a phone app.  Generally, we only contact you if it is a positive test result.  In the meantime, if you develop worsening symptoms including fever, chest pain, shortness of breath despite our current treatment plan then please report to the emergency room as this may be a sign of worsening status from possible viral infection.  Otherwise, we will manage this as a viral syndrome. For sore throat or cough try using a honey-based tea. Use 3 teaspoons of honey with juice squeezed from half lemon. Place shaved pieces of ginger into 1/2-1 cup of water and warm over stove top. Then mix the ingredients and repeat every 4 hours as needed. Please take Tylenol '500mg'$ -'650mg'$  every 6 hours for aches and pains, fevers. Hydrate very well with at least 2 liters of water. Eat light meals such as soups to replenish electrolytes and soft fruits, veggies. Start an antihistamine like Zyrtec ('10mg'$  daily) for postnasal drainage, sinus congestion.  You can take this together with pseudoephedrine (Sudafed) at a dose of 30 mg 2-3 times a day as needed for the same kind of congestion.

## 2022-03-27 NOTE — ED Triage Notes (Addendum)
Sore throat since Sunday Joint pain Monday Pt has tried Zicam.

## 2022-03-27 NOTE — ED Provider Notes (Signed)
[facility]  Note:  This document was prepared using Systems analyst and may include unintentional dictation errors.  MRN: [id number] DOB: 01/13/1966  Subjective:   Jonathan Ferrell is a 57 y.o. male presenting for 3-day history of acute onset throat pain, painful swallowing, mild body pains.  Has been using Zicam.  Has had minimal cough.  No chest pain, shortness of breath or wheezing.  Has a history of upper airway cough syndrome.  No smoking.  No current facility-administered medications for this encounter.  Current Outpatient Medications:    Ascorbic Acid (VITAMIN C) 1000 MG tablet, Take 1,000 mg by mouth daily., Disp: , Rfl:    azithromycin (ZITHROMAX) 250 MG tablet, Take 2 on day one then 1 daily x 4 days, Disp: 6 tablet, Rfl: 0   cetirizine (ZYRTEC) 10 MG tablet, Take 10 mg by mouth daily., Disp: , Rfl:    fluticasone (FLONASE) 50 MCG/ACT nasal spray, Place 2 sprays into both nostrils 2 (two) times daily., Disp: , Rfl:    Multiple Vitamins-Minerals (ALIVE MENS ENERGY PO), Take 1 tablet by mouth daily., Disp: , Rfl:    nebivolol (BYSTOLIC) 5 MG tablet, Take 1 tablet (5 mg total) by mouth daily., Disp: 90 tablet, Rfl: 2   Omega-3 Fatty Acids (FISH OIL) 1200 MG CAPS, Take 1 capsule by mouth daily., Disp: , Rfl:    predniSONE (DELTASONE) 10 MG tablet, Take  4 each am x 2 days,   2 each am x 2 days,  1 each am x 2 days and stop, Disp: 14 tablet, Rfl: 0   No Known Allergies  Past Medical History:  Diagnosis Date   Hypertension      History reviewed. No pertinent surgical history.  History reviewed. No pertinent family history.  Social History   Tobacco Use   Smoking status: Never   Smokeless tobacco: Never  Substance Use Topics   Alcohol use: No    Alcohol/week: 0.0 standard drinks of alcohol   Drug use: No    ROS   Objective:   Vitals: BP (!) 144/89 (BP Location: Right Arm)   Pulse 68   Temp 99.2 F (37.3 C) (Oral)    Resp 16   SpO2 95%   BP Readings from Last 3 Encounters:  03/27/22 (!) 144/89  04/03/15 (!) 132/92  05/20/14 130/84   Physical Exam Constitutional:      General: He is not in acute distress.    Appearance: Normal appearance. He is well-developed and normal weight. He is not ill-appearing, toxic-appearing or diaphoretic.  HENT:     Head: Normocephalic and atraumatic.     Right Ear: External ear normal.     Left Ear: External ear normal.     Nose: Nose normal.     Mouth/Throat:     Mouth: Mucous membranes are moist.     Pharynx: Posterior oropharyngeal erythema (with thick post-nasal drainage overlying pharynx) present. No pharyngeal swelling, oropharyngeal exudate or uvula swelling.     Tonsils: No tonsillar exudate or tonsillar abscesses. 0 on the right. 0 on the left.  Eyes:     General: No scleral icterus.       Right eye: No discharge.        Left eye: No discharge.     Extraocular Movements: Extraocular movements intact.  Cardiovascular:     Rate and Rhythm: Normal rate and regular rhythm.     Heart sounds: Normal heart sounds. No murmur heard.  No friction rub. No gallop.  Pulmonary:     Effort: Pulmonary effort is normal. No respiratory distress.     Breath sounds: Normal breath sounds. No stridor. No wheezing, rhonchi or rales.  Musculoskeletal:     Cervical back: Normal range of motion.  Neurological:     Mental Status: He is alert and oriented to person, place, and time.  Psychiatric:        Mood and Affect: Mood normal.        Behavior: Behavior normal.        Thought Content: Thought content normal.        Judgment: Judgment normal.     Results for orders placed or performed during the hospital encounter of 03/27/22 (from the past 24 hour(s))  POCT rapid strep A     Status: None   Collection Time: 03/27/22  7:24 PM  Result Value Ref Range   Rapid Strep A Screen Negative Negative    Assessment and Plan :   PDMP not reviewed this encounter.  1. Acute  viral syndrome   2. Throat pain   3. Essential hypertension     Deferred imaging given clear cardiopulmonary exam, hemodynamically stable vital signs. Will manage for viral illness such as viral URI, viral syndrome, viral rhinitis, COVID-19, viral pharyngitis. Recommended supportive care. Offered scripts for symptomatic relief. COVID 19 and strep culture are pending. Counseled patient on potential for adverse effects with medications prescribed/recommended today, ER and return-to-clinic precautions discussed, patient verbalized understanding.    Patient would be a good candidate for Paxlovid should he test positive for COVID-19.   Jaynee Eagles, [place] 03/27/22 1949

## 2022-03-28 LAB — SARS CORONAVIRUS 2 (TAT 6-24 HRS): SARS Coronavirus 2: NEGATIVE

## 2022-03-30 LAB — CULTURE, GROUP A STREP (THRC)
